# Patient Record
Sex: Male | Born: 2000 | Race: Black or African American | Hispanic: No | Marital: Single | Smoking: Never smoker
Health system: Southern US, Community
[De-identification: ages and names within clinical notes are randomized; demographics above are authoritative.]

---
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
## 2018-06-01 ENCOUNTER — Other Ambulatory Visit: Payer: Self-pay

## 2018-06-01 ENCOUNTER — Encounter (HOSPITAL_COMMUNITY): Payer: Self-pay | Admitting: *Deleted

## 2018-06-01 ENCOUNTER — Emergency Department (HOSPITAL_COMMUNITY)
Admission: EM | Admit: 2018-06-01 | Discharge: 2018-06-02 | Disposition: A | Discharge status: Home / Self Care | Payer: Self-pay | Attending: Emergency Medicine | Admitting: Emergency Medicine

## 2018-06-01 DIAGNOSIS — R112 Nausea with vomiting, unspecified: Secondary | ICD-10-CM

## 2018-06-01 DIAGNOSIS — J029 Acute pharyngitis, unspecified: Secondary | ICD-10-CM | POA: Insufficient documentation

## 2018-06-01 DIAGNOSIS — G44219 Episodic tension-type headache, not intractable: Secondary | ICD-10-CM | POA: Insufficient documentation

## 2018-06-01 NOTE — ED Triage Notes (Signed)
Pt reports that he has had a headache for about 1.5 weeks, he says that he has some dizziness and vomiting  (only when he wakes up in the mornings). Has been taking aleve and day quil for his symptoms.

## 2018-06-01 NOTE — ED Provider Notes (Signed)
[facility] Note  CSN: [id number] Arrival date & time: 06/01/18 2012  Chief Complaint(s) Emesis  HPI Jimmy Colon is a 17 y.o. male   The history is provided by the patient.  Emesis   This is a new problem. Episode onset: 1.5 weeks. Episode frequency: 1-2 times in the morning. The problem has not changed since onset.The emesis has an appearance of stomach contents. Associated symptoms include headaches and URI. Pertinent negatives include no abdominal pain, no arthralgias, no cough, no fever and no sweats.   Patient reports that the emesis is due to coughing in the morning.  Denies any weakness or numbness.  Past Medical History History reviewed. No pertinent past medical history. There are no active problems to display for this patient.  Home Medication(s) Prior to Admission medications   Medication Sig Start Date End Date Taking? Authorizing Provider  hydroxypropyl methylcellulose / hypromellose (ISOPTO TEARS / GONIOVISC) 2.5 % ophthalmic solution Place 1 drop into both eyes 3 (three) times daily as needed for dry eyes.   Yes [provider]  naproxen sodium (ALEVE) 220 MG tablet Take 440 mg by mouth 2 (two) times daily as needed (pain).   Yes [provider]                                                                                                                                    Past Surgical History History reviewed. No pertinent surgical history. Family History No family history on file.  Social History Social History   Tobacco Use  . Smoking status: Never Smoker  . Smokeless tobacco: Never Used  Substance Use Topics  . Alcohol use: Never    Frequency: Never  . Drug use: Never   Allergies Patient has no known allergies.  Review of Systems Review of Systems  Constitutional: Negative for fever.  Respiratory: Negative for cough.   Gastrointestinal: Positive for vomiting. Negative for  abdominal pain.  Musculoskeletal: Negative for arthralgias.  Neurological: Positive for headaches.   All other systems are reviewed and are negative for acute change except as noted in the HPI  Physical Exam Vital Signs  I have reviewed the triage vital signs BP (!) 149/94   Pulse (!) 55   Temp 98.6 F (37 C) (Oral)   Resp 18   SpO2 100%   Physical Exam  Constitutional: He is oriented to person, place, and time. He appears well-developed and well-nourished. No distress.  HENT:  Head: Normocephalic and atraumatic.  Nose: Nose normal.  Mouth/Throat: No uvula swelling. Posterior oropharyngeal erythema present. No oropharyngeal exudate or posterior oropharyngeal edema. Tonsils are 2+ on the right. Tonsils are 2+ on the left. Tonsillar exudate.  Eyes: Pupils are equal, round, and reactive to light. Conjunctivae and EOM are normal. Right eye exhibits no discharge. Left eye exhibits no discharge. No scleral icterus.  Neck: Normal range of motion. Neck supple.  Cardiovascular: Normal rate and regular rhythm. Exam reveals no gallop and no friction rub.  No murmur heard. Pulmonary/Chest: Effort normal and breath sounds normal. No stridor. No respiratory distress. He has no rales.  Abdominal: Soft. He exhibits no distension. There is no tenderness.  Musculoskeletal: He exhibits no edema or tenderness.  Neurological: He is alert and oriented to person, place, and time.  Skin: Skin is warm and dry. No rash noted. He is not diaphoretic. No erythema.  Psychiatric: He has a normal mood and affect.  Vitals reviewed.   ED Results and Treatments Labs (all labs ordered are listed, but only abnormal results are displayed) Labs Reviewed  GROUP A STREP BY PCR                                                                                                                         EKG  EKG Interpretation  Date/Time:    Ventricular Rate:    PR Interval:    QRS Duration:   QT Interval:    QTC  Calculation:   R Axis:     Text Interpretation:        Radiology No results found. Pertinent labs & imaging results that were available during my care of the patient were reviewed by me and considered in my medical decision making (see chart for details).  Medications Ordered in ED Medications - No data to display                                                                                                                                  Procedures Procedures  (including critical care time)  Medical Decision Making / ED Course I have reviewed the nursing notes for this encounter and the patient's prior records (if available in EHR or on provided paperwork).    Patient presents with URI symptoms including headache (asymptomatic at this time), nasal congestion, cough, sore throat with posttussive emesis.  Patient is well-appearing, well-hydrated and nontoxic.  Exam confirming pharyngitis. Lungs CTAB.  Exam nonfocal.  Centor criteria 1.  Likely viral patient and mother requested strep testing.  Rapid strep negative.  Supportive management recommended.  The patient appears reasonably screened and/or stabilized for discharge and I doubt any other medical condition or other [facility] requiring further screening, evaluation, or treatment in the ED at this time prior to discharge.  The patient is safe for discharge with strict return  precautions.   Final Clinical Impression(s) / ED Diagnoses Final diagnoses:  Viral pharyngitis  Episodic tension-type headache, not intractable  Non-intractable vomiting with nausea, unspecified vomiting type   Disposition: Discharge  Condition: Good  I have discussed the results, Dx and Tx plan with the patient who expressed understanding and agree(s) with the plan. Discharge instructions discussed at great length. The patient was given strict return precautions who verbalized understanding of the instructions. No further questions at time of discharge.      ED Discharge Orders    None       Follow Up: Primary care provider   If you do not have a primary care physician, contact [facility] at [phone] for referral      This chart was dictated using voice recognition software.  Despite best efforts to proofread,  errors can occur which can change the documentation meaning.   Nira Conn, MD 06/02/18 [phone]

## 2018-06-02 LAB — GROUP A STREP BY PCR: Group A Strep by PCR: NOT DETECTED

## 2018-06-02 NOTE — Discharge Instructions (Addendum)
You may take over-the-counter medicine for symptomatic relief, such as Tylenol, Motrin, TheraFlu, Alka seltzer , black elderberry, etc. Please limit acetaminophen (Tylenol) to 4000 mg and Ibuprofen (Motrin, Advil, etc.) to 2400 mg for a 24hr period. Please note that other over-the-counter medicine may contain acetaminophen or ibuprofen as a component of their ingredients.   

## 2021-08-05 ENCOUNTER — Encounter (HOSPITAL_BASED_OUTPATIENT_CLINIC_OR_DEPARTMENT_OTHER): Payer: Self-pay | Admitting: Obstetrics and Gynecology

## 2021-08-05 ENCOUNTER — Emergency Department (HOSPITAL_BASED_OUTPATIENT_CLINIC_OR_DEPARTMENT_OTHER): Payer: Medicaid Other

## 2021-08-05 ENCOUNTER — Emergency Department (HOSPITAL_BASED_OUTPATIENT_CLINIC_OR_DEPARTMENT_OTHER): Payer: Medicaid Other | Admitting: Radiology

## 2021-08-05 ENCOUNTER — Other Ambulatory Visit: Payer: Self-pay

## 2021-08-05 DIAGNOSIS — M542 Cervicalgia: Secondary | ICD-10-CM | POA: Diagnosis not present

## 2021-08-05 DIAGNOSIS — R0781 Pleurodynia: Secondary | ICD-10-CM | POA: Insufficient documentation

## 2021-08-05 DIAGNOSIS — R519 Headache, unspecified: Secondary | ICD-10-CM | POA: Insufficient documentation

## 2021-08-05 DIAGNOSIS — Y92410 Unspecified street and highway as the place of occurrence of the external cause: Secondary | ICD-10-CM | POA: Insufficient documentation
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
## 2021-08-06 ENCOUNTER — Emergency Department (HOSPITAL_BASED_OUTPATIENT_CLINIC_OR_DEPARTMENT_OTHER)
Admission: EM | Admit: 2021-08-06 | Discharge: 2021-08-06 | Disposition: A | Discharge status: Home / Self Care | Payer: Medicaid Other | Attending: Emergency Medicine | Admitting: Emergency Medicine

## 2021-08-06 MED ORDER — LIDOCAINE 5 % EX PTCH
1.0000 | MEDICATED_PATCH | CUTANEOUS | Status: DC
  Administered 2021-08-06: 1 via TRANSDERMAL
  Filled 2021-08-06: qty 1

## 2021-08-06 MED ORDER — ACETAMINOPHEN 500 MG PO TABS
1000.0000 mg | ORAL_TABLET | Freq: Once | ORAL | Status: AC
  Administered 2021-08-06: 1000 mg via ORAL
  Filled 2021-08-06: qty 2

## 2021-08-06 MED ORDER — NAPROXEN 250 MG PO TABS
500.0000 mg | ORAL_TABLET | ORAL | Status: AC
  Administered 2021-08-06: 500 mg via ORAL
  Filled 2021-08-06: qty 2

## 2021-08-06 MED ORDER — LIDOCAINE 5 % EX PTCH
1.0000 | MEDICATED_PATCH | CUTANEOUS | 0 refills | Status: AC
Start: 2021-08-06 — End: ?

## 2021-08-06 MED ORDER — NAPROXEN 375 MG PO TABS: 375.0000 mg | ORAL_TABLET | Freq: Two times a day (BID) | ORAL | 0 refills | Status: AC
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
## 2024-03-04 ENCOUNTER — Emergency Department (HOSPITAL_COMMUNITY)
Admission: EM | Admit: 2024-03-04 | Discharge: 2024-03-05 | Disposition: A | Discharge status: Home / Self Care | Payer: Self-pay | Attending: Emergency Medicine | Admitting: Emergency Medicine

## 2024-03-04 ENCOUNTER — Other Ambulatory Visit: Payer: Self-pay

## 2024-03-04 ENCOUNTER — Encounter (HOSPITAL_COMMUNITY): Payer: Self-pay

## 2024-03-04 DIAGNOSIS — R000 Tachycardia, unspecified: Secondary | ICD-10-CM | POA: Insufficient documentation

## 2024-03-04 DIAGNOSIS — A419 Sepsis, unspecified organism: Secondary | ICD-10-CM | POA: Insufficient documentation

## 2024-03-04 DIAGNOSIS — K047 Periapical abscess without sinus: Secondary | ICD-10-CM | POA: Insufficient documentation

## 2024-03-04 LAB — I-STAT CG4 LACTIC ACID, ED: Lactic Acid, Venous: 0.6 mmol/L (ref 0.5–1.9)

## 2024-03-04 MED ORDER — ACETAMINOPHEN 325 MG PO TABS
650.0000 mg | ORAL_TABLET | Freq: Once | ORAL | Status: AC | PRN
  Administered 2024-03-04: 650 mg via ORAL
  Filled 2024-03-04: qty 2

## 2024-03-04 NOTE — ED Triage Notes (Signed)
 Pt states that he has had a cracked L lower tooth x"a few months" and has been unable to get a dentist appt. Pt now has an abscess in the L lower jaw. No fevers at home.

## 2024-03-05 ENCOUNTER — Emergency Department (HOSPITAL_COMMUNITY): Payer: Self-pay

## 2024-03-05 LAB — COMPREHENSIVE METABOLIC PANEL WITH GFR
ALT: 12 U/L (ref 0–44)
AST: 17 U/L (ref 15–41)
Albumin: 4.7 g/dL (ref 3.5–5.0)
Alkaline Phosphatase: 57 U/L (ref 38–126)
Anion gap: 12 (ref 5–15)
BUN: 11 mg/dL (ref 6–20)
CO2: 23 mmol/L (ref 22–32)
Calcium: 9.5 mg/dL (ref 8.9–10.3)
Chloride: 100 mmol/L (ref 98–111)
Creatinine, Ser: 1.1 mg/dL (ref 0.61–1.24)
GFR, Estimated: >60 mL/min (ref >60)
Glucose, Bld: 113 mg/dL — ABNORMAL HIGH (ref 70–99)
Potassium: 3.3 mmol/L — ABNORMAL LOW (ref 3.5–5.1)
Sodium: 135 mmol/L (ref 135–145)
Total Bilirubin: 1.1 mg/dL (ref 0.0–1.2)
Total Protein: 7.9 g/dL (ref 6.5–8.1)

## 2024-03-05 LAB — CBC WITH DIFFERENTIAL/PLATELET
Abs Immature Granulocytes: 0.04 10*3/uL (ref 0.00–0.07)
Basophils Absolute: 0 10*3/uL (ref 0.0–0.1)
Basophils Relative: 0 %
Eosinophils Absolute: 0 10*3/uL (ref 0.0–0.5)
Eosinophils Relative: 0 %
HCT: 47.4 % (ref 39.0–52.0)
Hemoglobin: 16.4 g/dL (ref 13.0–17.0)
Immature Granulocytes: 0 %
Lymphocytes Relative: 14 %
Lymphs Abs: 2.2 10*3/uL (ref 0.7–4.0)
MCH: 29.9 pg (ref 26.0–34.0)
MCHC: 34.6 g/dL (ref 30.0–36.0)
MCV: 86.5 fL (ref 80.0–100.0)
Monocytes Absolute: 0.8 10*3/uL (ref 0.1–1.0)
Monocytes Relative: 5 %
Neutro Abs: 12.4 10*3/uL — ABNORMAL HIGH (ref 1.7–7.7)
Neutrophils Relative %: 81 %
Platelets: 205 10*3/uL (ref 150–400)
RBC: 5.48 MIL/uL (ref 4.22–5.81)
RDW: 12.4 % (ref 11.5–15.5)
WBC: 15.5 10*3/uL — ABNORMAL HIGH (ref 4.0–10.5)
nRBC: 0 % (ref 0.0–0.2)

## 2024-03-05 LAB — PROTIME-INR
INR: 1.2 (ref 0.8–1.2)
Prothrombin Time: 15 s (ref 11.4–15.2)

## 2024-03-05 MED ORDER — LACTATED RINGERS IV BOLUS
1000.0000 mL | Freq: Once | INTRAVENOUS | Status: AC
  Administered 2024-03-05: 1000 mL via INTRAVENOUS

## 2024-03-05 MED ORDER — FENTANYL CITRATE PF 50 MCG/ML IJ SOSY
50.0000 ug | PREFILLED_SYRINGE | Freq: Once | INTRAMUSCULAR | Status: AC
Start: 2024-03-05 — End: 2024-03-05
  Administered 2024-03-05: 50 ug via INTRAVENOUS
  Filled 2024-03-05: qty 1

## 2024-03-05 MED ORDER — IOHEXOL 350 MG/ML SOLN
75.0000 mL | Freq: Once | INTRAVENOUS | Status: AC | PRN
  Administered 2024-03-05: 75 mL via INTRAVENOUS

## 2024-03-05 MED ORDER — CLINDAMYCIN PHOSPHATE 600 MG/50ML IV SOLN
600.0000 mg | Freq: Once | INTRAVENOUS | Status: AC
  Administered 2024-03-05: 600 mg via INTRAVENOUS
  Filled 2024-03-05: qty 50

## 2024-03-05 MED ORDER — CLINDAMYCIN HCL 300 MG PO CAPS: 300.0000 mg | ORAL_CAPSULE | Freq: Three times a day (TID) | ORAL | 0 refills | Status: AC

## 2024-03-05 NOTE — Plan of Care (Addendum)
 Patient is in sepsis in the context of dental abscess.  Deferring admission at this time given there is no dental surgeon coverage in the nighttime to drain the abscess.  Requested Dr. Alison Irvine to reach out to dental surgeon who is available after 7 AM if dental surgeon can drain the abscess hospitalist can admit this patient eventually otherwise this patient need to be transferred to tertiary care service with a dental surgeon coverage.  Russia Scheiderer, MD [facility] 03/05/2024, 3:28 AM

## 2024-03-05 NOTE — Discharge Instructions (Addendum)
 Call Dr. Hedwig Livers office today at 8 AM for an appointment later today.  They will tell you what time to arrive.  Take the antibiotics as prescribed unless Dr. Vena Gibes tells you differently.  Return to the ED with difficulty breathing, difficulty swallowing, not able to eat or drink or other concerns.

## 2024-03-05 NOTE — ED Provider Notes (Addendum)
 [place] EMERGENCY DEPARTMENT AT [facility] Provider Note   CSN: [id number] Arrival date & time: 03/04/24  2323     History  Chief Complaint  Patient presents with   Oral Swelling    Jimmy Colon is a 23 y.o. male.  Patient presents with concern for dental abscess.  He states he has not had a cracked tooth to his left lower jaw for a while but became painful in the past couple days.  No new injury.  He is seen a dentist in the past but was not able to afford extraction.  Developed swelling to his left face today with increased pain.  No difficulty breathing or difficulty swallowing.  Febrile and tachycardic on arrival but denies any fever at home.  No vomiting.  No chest pain or shortness of breath.  No difficulty swallowing currently.  No abdominal pain.  No history of diabetes.  Does not take any regular medications.  No known medical history.  Denies history of diabetes.  The history is provided by the patient.       Home Medications Prior to Admission medications   Medication Sig Start Date End Date Taking? Authorizing Provider  hydroxypropyl methylcellulose / hypromellose (ISOPTO TEARS / GONIOVISC) 2.5 % ophthalmic solution Place 1 drop into both eyes 3 (three) times daily as needed for dry eyes.    [provider]  lidocaine  (LIDODERM ) 5 % Place 1 patch onto the skin daily. Remove & Discard patch within 12 hours or as directed by MD 08/06/21   Maralee Senate, April, MD  naproxen  (NAPROSYN ) 375 MG tablet Take 1 tablet (375 mg total) by mouth 2 (two) times daily. 08/06/21   Palumbo, April, MD  naproxen  sodium (ALEVE ) 220 MG tablet Take 440 mg by mouth 2 (two) times daily as needed (pain).    [provider]      Allergies    Patient has no known allergies.    Review of Systems   Review of Systems  Constitutional:  Negative for activity change, appetite change and fever.  HENT:  Positive for dental problem. Negative for congestion and trouble  swallowing.   Respiratory:  Negative for cough, chest tightness and shortness of breath.   Gastrointestinal:  Negative for nausea and vomiting.  Genitourinary:  Negative for dysuria and hematuria.  Skin:  Positive for wound.  Neurological:  Negative for dizziness, weakness and headaches.   all other systems are negative except as noted in the HPI and PMH.    Physical Exam Updated Vital Signs BP 130/83   Pulse 88   Temp (!) 101 F (38.3 C) (Oral)   Resp 16   Ht 6' (1.829 m)   Wt 77.6 kg   SpO2 100%   BMI 23.19 kg/m  Physical Exam Vitals and nursing note reviewed.  Constitutional:      General: He is not in acute distress.    Appearance: He is well-developed.  HENT:     Head: Normocephalic and atraumatic.     Mouth/Throat:     Pharynx: No oropharyngeal exudate.     Comments: Swelling and induration left mandible.  Controlling secretions.  Floor of mouth is soft.  No trismus or malocclusion.  No obvious fluctuance but there is induration along the gumline.  Fractured 1st and 2nd molar of the left lower jaw. Eyes:     Conjunctiva/sclera: Conjunctivae normal.     Pupils: Pupils are equal, round, and reactive to light.  Neck:  Comments: No meningismus. Cardiovascular:     Rate and Rhythm: Regular rhythm. Tachycardia present.     Heart sounds: Normal heart sounds. No murmur heard. Pulmonary:     Effort: Pulmonary effort is normal. No respiratory distress.     Breath sounds: Normal breath sounds.  Abdominal:     Palpations: Abdomen is soft.     Tenderness: There is no abdominal tenderness. There is no guarding or rebound.  Musculoskeletal:        General: No tenderness. Normal range of motion.     Cervical back: Normal range of motion and neck supple.  Skin:    General: Skin is warm.  Neurological:     Mental Status: He is alert and oriented to person, place, and time.     Cranial Nerves: No cranial nerve deficit.     Motor: No abnormal muscle tone.     Coordination:  Coordination normal.     Comments: No ataxia on finger to nose bilaterally. No pronator drift. 5/5 strength throughout. CN 2-12 intact.Equal grip strength. Sensation intact.   Psychiatric:        Behavior: Behavior normal.     ED Results / Procedures / Treatments   Labs (all labs ordered are listed, but only abnormal results are displayed) Labs Reviewed  COMPREHENSIVE METABOLIC PANEL WITH GFR - Abnormal; Notable for the following components:      Result Value   Potassium 3.3 (*)    Glucose, Bld 113 (*)    All other components within normal limits  CBC WITH DIFFERENTIAL/PLATELET - Abnormal; Notable for the following components:   WBC 15.5 (*)    Neutro Abs 12.4 (*)    All other components within normal limits  CULTURE, BLOOD (ROUTINE X 2)  CULTURE, BLOOD (ROUTINE X 2)  PROTIME-INR  I-STAT CG4 LACTIC ACID, ED  I-STAT CG4 LACTIC ACID, ED    EKG None  Radiology CT Maxillofacial W Contrast Result Date: 03/05/2024 CLINICAL DATA:  Initial evaluation for acute soft tissue infection. EXAM: CT MAXILLOFACIAL WITH CONTRAST TECHNIQUE: Multidetector CT imaging of the maxillofacial structures was performed with intravenous contrast. Multiplanar CT image reconstructions were also generated. RADIATION DOSE REDUCTION: This exam was performed according to the departmental dose-optimization program which includes automated exposure control, adjustment of the mA and/or kV according to patient size and/or use of iterative reconstruction technique. CONTRAST:  75mL OMNIPAQUE IOHEXOL 350 MG/ML SOLN COMPARISON:  None Available. FINDINGS: Osseous: No acute osseous abnormality. No worrisome osseous lesions. Orbits: Globes and orbital soft tissues within normal limits. Sinuses: Mild mucosal thickening present about the ethmoidal air cells and maxillary sinuses. Paranasal sinuses are otherwise largely clear. Mastoid air cells and middle ear cavities are clear as well. Soft tissues: Soft tissue swelling with  inflammatory stranding seen involving the lower left facial soft tissues, adjacent to the left mandibular body. Findings concerning for acute infection/cellulitis. Scattered dental caries noted, suggesting an odontogenic origin. Small rim enhancing hypodense collection along the buccal aspect of the left mandibular body measures 1.6 x 0.3 x 1.3 cm, consistent with a small odontogenic abscess (series 2, image 67). No overt extension into the sublingual space or floor of mouth to suggest Ludwig's at this time. Limited intracranial: Unremarkable. IMPRESSION: Soft tissue swelling with inflammatory stranding involving the left lower facial soft tissues, consistent with acute infection/cellulitis. Scattered dental caries within the underlying teeth, suggesting an odontogenic origin. Superimposed 1.6 x 0.3 x 1.3 cm odontogenic abscess along the left mandibular body as above. Electronically Signed  By: Virgia Griffins M.D.   On: 03/05/2024 02:05    Procedures Procedures    Medications Ordered in ED Medications  lactated ringers bolus 1,000 mL (has no administration in time range)  clindamycin (CLEOCIN) IVPB 600 mg (has no administration in time range)  fentaNYL (SUBLIMAZE) injection 50 mcg (has no administration in time range)  acetaminophen  (TYLENOL ) tablet 650 mg (650 mg Oral Given 03/04/24 2338)    ED Course/ Medical Decision Making/ A&P                                 Medical Decision Making Amount and/or Complexity of Data Reviewed Labs: ordered. Decision-making details documented in ED Course. Radiology: ordered and independent interpretation performed. Decision-making details documented in ED Course. ECG/medicine tests: ordered and independent interpretation performed. Decision-making details documented in ED Course.  Risk OTC drugs. Prescription drug management.   Dental infection, possible abscess.  Meets sepsis criteria with fever, tachycardia and lactic acidosis.  Will initiate  IV fluids and broad-spectrum antibiotics after cultures are obtained.  Maintaining airway currently.  No evidence of Ludwig's angina.  Will obtain CT scan for further evaluation of drainable abscess.  Patient meets sepsis criteria with elevated lactic acid, leukocytosis and fever.  he is given IV fluids and IV antibiotics.  CT scan shows extensive dental cellulitis with possible small Focal abscess No evidence of Ludwig angina. Small odontogenic abscess of 1.6 x 1.3 x 0.6 cm.  Oral surgery not available overnight.  However Dr. Vena Gibes will be on service at 7 AM.  Will plan a hospitalist admission for IV antibiotics and monitoring for oral surgery follow-up in the morning.  Discussed with Dr. Ulice Gamer of hospitalist service.  She states not able to admit the patient until oral surgery has known availability.  Will attempt to call Dr. Vena Gibes after 7am.   Addendum: Spoke with Dr. Vena Gibes at 7 AM.  He agrees to see patient in the office today and recommends he go there directly from the ED.  He was given IV clindamycin in the ED.  On recheck his vitals have stabilized and sepsis pathology has resolved.  He is tolerating p.o.  No drooling or trismus.  Controlling secretions.  He appears stable for transfer for outpatient evaluation. Patient agrees to go to oral surgery office directly.  Clindamycin prescription sent to pharmacy.    Final Clinical Impression(s) / ED Diagnoses Final diagnoses:  None    Rx / DC Orders ED Discharge Orders     None         Kamiya Acord, Mara [place], MD 03/05/24 6045    Earma Gloss, MD 03/05/24 [id number]

## 2024-03-10 LAB — CULTURE, BLOOD (ROUTINE X 2)
Culture: NO GROWTH
Culture: NO GROWTH
Special Requests: ADEQUATE
Special Requests: ADEQUATE
